# Patient Record
Sex: Female | Born: 1995 | Race: White | Hispanic: No | Marital: Married | Smoking: Never smoker
Health system: Southern US, Community
[De-identification: ages and names within clinical notes are randomized; demographics above are authoritative.]

## PROBLEM LIST (undated history)

## (undated) HISTORY — PX: TONSILLECTOMY: SUR1361

---

## 2019-09-12 ENCOUNTER — Other Ambulatory Visit: Payer: Self-pay | Admitting: Obstetrics and Gynecology

## 2019-09-12 DIAGNOSIS — Z1231 Encounter for screening mammogram for malignant neoplasm of breast: Secondary | ICD-10-CM

## 2019-09-12 DIAGNOSIS — Z1501 Genetic susceptibility to malignant neoplasm of breast: Secondary | ICD-10-CM

## 2019-11-03 ENCOUNTER — Ambulatory Visit
Admission: RE | Admit: 2019-11-03 | Discharge: 2019-11-03 | Disposition: A | Discharge status: Home / Self Care | Payer: BC Managed Care – PPO | Source: Ambulatory Visit | Attending: Obstetrics and Gynecology | Admitting: Obstetrics and Gynecology

## 2019-11-03 ENCOUNTER — Other Ambulatory Visit: Payer: Self-pay

## 2019-11-03 DIAGNOSIS — Z1509 Genetic susceptibility to other malignant neoplasm: Secondary | ICD-10-CM

## 2019-11-03 DIAGNOSIS — Z1231 Encounter for screening mammogram for malignant neoplasm of breast: Secondary | ICD-10-CM

## 2019-11-03 DIAGNOSIS — Z1501 Genetic susceptibility to malignant neoplasm of breast: Secondary | ICD-10-CM

## 2019-11-04 ENCOUNTER — Other Ambulatory Visit: Payer: Self-pay | Admitting: Obstetrics and Gynecology

## 2019-11-04 DIAGNOSIS — R928 Other abnormal and inconclusive findings on diagnostic imaging of breast: Secondary | ICD-10-CM

## 2019-11-07 ENCOUNTER — Other Ambulatory Visit: Payer: Self-pay

## 2019-11-07 ENCOUNTER — Ambulatory Visit
Admission: RE | Admit: 2019-11-07 | Discharge: 2019-11-07 | Disposition: A | Discharge status: Home / Self Care | Payer: BC Managed Care – PPO | Source: Ambulatory Visit | Attending: Obstetrics and Gynecology | Admitting: Obstetrics and Gynecology

## 2019-11-07 DIAGNOSIS — R928 Other abnormal and inconclusive findings on diagnostic imaging of breast: Secondary | ICD-10-CM

## 2020-01-29 ENCOUNTER — Other Ambulatory Visit: Payer: Self-pay | Admitting: Obstetrics and Gynecology

## 2020-01-29 DIAGNOSIS — Z1501 Genetic susceptibility to malignant neoplasm of breast: Secondary | ICD-10-CM

## 2020-04-06 ENCOUNTER — Other Ambulatory Visit: Payer: Self-pay

## 2020-04-06 ENCOUNTER — Ambulatory Visit
Admission: RE | Admit: 2020-04-06 | Discharge: 2020-04-06 | Disposition: A | Discharge status: Home / Self Care | Payer: BC Managed Care – PPO | Source: Ambulatory Visit | Attending: Obstetrics and Gynecology | Admitting: Obstetrics and Gynecology

## 2020-04-06 DIAGNOSIS — Z1501 Genetic susceptibility to malignant neoplasm of breast: Secondary | ICD-10-CM

## 2020-04-06 MED ORDER — GADOBUTROL 1 MMOL/ML IV SOLN
6.0000 mL | Freq: Once | INTRAVENOUS | Status: AC | PRN
  Administered 2020-04-06: 6 mL via INTRAVENOUS

## 2021-09-26 ENCOUNTER — Other Ambulatory Visit: Payer: Self-pay | Admitting: Obstetrics and Gynecology

## 2021-09-26 DIAGNOSIS — R928 Other abnormal and inconclusive findings on diagnostic imaging of breast: Secondary | ICD-10-CM

## 2021-10-31 ENCOUNTER — Ambulatory Visit
Admission: RE | Admit: 2021-10-31 | Discharge: 2021-10-31 | Disposition: A | Discharge status: Home / Self Care | Payer: No Typology Code available for payment source | Source: Ambulatory Visit | Attending: Obstetrics and Gynecology | Admitting: Obstetrics and Gynecology

## 2021-10-31 ENCOUNTER — Other Ambulatory Visit: Payer: Self-pay

## 2021-10-31 ENCOUNTER — Other Ambulatory Visit: Payer: Self-pay | Admitting: Obstetrics and Gynecology

## 2021-10-31 DIAGNOSIS — R928 Other abnormal and inconclusive findings on diagnostic imaging of breast: Secondary | ICD-10-CM

## 2021-10-31 DIAGNOSIS — N6489 Other specified disorders of breast: Secondary | ICD-10-CM

## 2021-11-09 ENCOUNTER — Ambulatory Visit
Admission: RE | Admit: 2021-11-09 | Discharge: 2021-11-09 | Disposition: A | Discharge status: Home / Self Care | Payer: No Typology Code available for payment source | Source: Ambulatory Visit | Attending: Obstetrics and Gynecology | Admitting: Obstetrics and Gynecology

## 2021-11-09 ENCOUNTER — Other Ambulatory Visit: Payer: Self-pay

## 2021-11-09 DIAGNOSIS — N6489 Other specified disorders of breast: Secondary | ICD-10-CM

## 2021-11-09 HISTORY — PX: BREAST BIOPSY: SHX20

## 2022-02-27 ENCOUNTER — Other Ambulatory Visit: Payer: Self-pay | Admitting: Obstetrics and Gynecology

## 2022-02-27 DIAGNOSIS — Z1501 Genetic susceptibility to malignant neoplasm of breast: Secondary | ICD-10-CM

## 2022-04-05 ENCOUNTER — Ambulatory Visit
Admission: RE | Admit: 2022-04-05 | Discharge: 2022-04-05 | Disposition: A | Discharge status: Home / Self Care | Payer: 59 | Source: Ambulatory Visit | Attending: Obstetrics and Gynecology | Admitting: Obstetrics and Gynecology

## 2022-04-05 DIAGNOSIS — Z1509 Genetic susceptibility to other malignant neoplasm: Secondary | ICD-10-CM

## 2022-04-05 MED ORDER — GADOBUTROL 1 MMOL/ML IV SOLN
6.0000 mL | Freq: Once | INTRAVENOUS | Status: AC | PRN
  Administered 2022-04-05: 6 mL via INTRAVENOUS

## 2022-09-28 ENCOUNTER — Other Ambulatory Visit: Payer: Self-pay | Admitting: Obstetrics and Gynecology

## 2022-09-28 ENCOUNTER — Encounter: Payer: Self-pay | Admitting: Obstetrics and Gynecology

## 2022-09-28 DIAGNOSIS — R928 Other abnormal and inconclusive findings on diagnostic imaging of breast: Secondary | ICD-10-CM

## 2022-10-12 ENCOUNTER — Ambulatory Visit
Admission: RE | Admit: 2022-10-12 | Discharge: 2022-10-12 | Disposition: A | Discharge status: Home / Self Care | Payer: 59 | Source: Ambulatory Visit | Attending: Obstetrics and Gynecology | Admitting: Obstetrics and Gynecology

## 2022-10-12 DIAGNOSIS — R928 Other abnormal and inconclusive findings on diagnostic imaging of breast: Secondary | ICD-10-CM

## 2022-12-20 LAB — OB RESULTS CONSOLE HEPATITIS B SURFACE ANTIGEN: Hepatitis B Surface Ag: NEGATIVE

## 2022-12-20 LAB — OB RESULTS CONSOLE GC/CHLAMYDIA
Chlamydia: NEGATIVE
Neisseria Gonorrhea: NEGATIVE

## 2022-12-20 LAB — OB RESULTS CONSOLE ABO/RH: RH Type: POSITIVE

## 2022-12-20 LAB — OB RESULTS CONSOLE RPR: RPR: NONREACTIVE

## 2022-12-20 LAB — OB RESULTS CONSOLE HIV ANTIBODY (ROUTINE TESTING): HIV: NONREACTIVE

## 2022-12-20 LAB — HEPATITIS C ANTIBODY: HCV Ab: NEGATIVE

## 2022-12-20 LAB — OB RESULTS CONSOLE RUBELLA ANTIBODY, IGM: Rubella: IMMUNE

## 2022-12-20 LAB — OB RESULTS CONSOLE ANTIBODY SCREEN: Antibody Screen: NEGATIVE

## 2023-06-18 LAB — OB RESULTS CONSOLE GBS: GBS: NEGATIVE

## 2023-07-02 ENCOUNTER — Encounter (HOSPITAL_COMMUNITY): Payer: Self-pay

## 2023-07-02 NOTE — Patient Instructions (Signed)
KESHONDRA SUPINGER  07/02/2023   Your procedure is scheduled on:  07/12/2023  Arrive at 1015 at Entrance C on [employer] at [facility]  and [employer]. You are invited to use the FREE valet parking or use the Visitor's parking deck.  Pick up the phone at the desk and dial [phone].  Call this number if you have problems the morning of surgery: [phone]  Remember:   Do not eat food:(After Midnight) Desps de medianoche.  Do not drink clear liquids: (After Midnight) Desps de medianoche.  Take these medicines the morning of surgery with A SIP OF WATER:  none   Do not wear jewelry, make-up or nail polish.  Do not wear lotions, powders, or perfumes. Do not wear deodorant.  Do not shave 48 hours prior to surgery.  Do not bring valuables to the hospital.  [facility] is not   responsible for any belongings or valuables brought to the hospital.  Contacts, dentures or bridgework may not be worn into surgery.  Leave suitcase in the car. After surgery it may be brought to your room.  For patients admitted to the hospital, checkout time is 11:00 AM the day of              discharge.      Please read over the following fact sheets that you were given:     Preparing for Surgery

## 2023-07-06 ENCOUNTER — Other Ambulatory Visit: Payer: Self-pay | Admitting: Obstetrics and Gynecology

## 2023-07-06 DIAGNOSIS — O321XX0 Maternal care for breech presentation, not applicable or unspecified: Secondary | ICD-10-CM

## 2023-07-10 ENCOUNTER — Encounter (HOSPITAL_COMMUNITY)
Admission: RE | Admit: 2023-07-10 | Discharge: 2023-07-10 | Disposition: A | Discharge status: Home / Self Care | Payer: 59 | Source: Ambulatory Visit | Attending: Obstetrics and Gynecology | Admitting: Obstetrics and Gynecology

## 2023-07-10 DIAGNOSIS — Z3A39 39 weeks gestation of pregnancy: Secondary | ICD-10-CM | POA: Insufficient documentation

## 2023-07-10 DIAGNOSIS — Z01812 Encounter for preprocedural laboratory examination: Secondary | ICD-10-CM | POA: Insufficient documentation

## 2023-07-10 DIAGNOSIS — O321XX0 Maternal care for breech presentation, not applicable or unspecified: Secondary | ICD-10-CM

## 2023-07-10 LAB — TYPE AND SCREEN
ABO/RH(D): A POS
Antibody Screen: NEGATIVE

## 2023-07-10 LAB — CBC
HCT: 34.3 % — ABNORMAL LOW (ref 36.0–46.0)
Hemoglobin: 11.2 g/dL — ABNORMAL LOW (ref 12.0–15.0)
MCH: 30.1 pg (ref 26.0–34.0)
MCHC: 32.7 g/dL (ref 30.0–36.0)
MCV: 92.2 fL (ref 80.0–100.0)
Platelets: 210 10*3/uL (ref 150–400)
RBC: 3.72 MIL/uL — ABNORMAL LOW (ref 3.87–5.11)
RDW: 11.9 % (ref 11.5–15.5)
WBC: 11.1 10*3/uL — ABNORMAL HIGH (ref 4.0–10.5)
nRBC: 0 % (ref 0.0–0.2)

## 2023-07-10 LAB — RPR: RPR Ser Ql: NONREACTIVE

## 2023-07-12 ENCOUNTER — Inpatient Hospital Stay (HOSPITAL_COMMUNITY): Payer: 59 | Admitting: Anesthesiology

## 2023-07-12 ENCOUNTER — Other Ambulatory Visit: Payer: Self-pay

## 2023-07-12 ENCOUNTER — Encounter (HOSPITAL_COMMUNITY)
Admission: RE | Disposition: A | Discharge status: Home / Self Care | Payer: Self-pay | Source: Home / Self Care | Attending: Obstetrics and Gynecology

## 2023-07-12 ENCOUNTER — Inpatient Hospital Stay (HOSPITAL_COMMUNITY)
Admission: RE | Admit: 2023-07-12 | Discharge: 2023-07-14 | DRG: 788 | Disposition: A | Discharge status: Home / Self Care | Payer: 59 | Attending: Obstetrics and Gynecology | Admitting: Obstetrics and Gynecology

## 2023-07-12 ENCOUNTER — Encounter (HOSPITAL_COMMUNITY): Payer: Self-pay | Admitting: Obstetrics and Gynecology

## 2023-07-12 DIAGNOSIS — O26893 Other specified pregnancy related conditions, third trimester: Secondary | ICD-10-CM | POA: Diagnosis present

## 2023-07-12 DIAGNOSIS — O321XX1 Maternal care for breech presentation, fetus 1: Secondary | ICD-10-CM

## 2023-07-12 DIAGNOSIS — Z3A39 39 weeks gestation of pregnancy: Secondary | ICD-10-CM | POA: Diagnosis not present

## 2023-07-12 DIAGNOSIS — O321XX0 Maternal care for breech presentation, not applicable or unspecified: Principal | ICD-10-CM | POA: Diagnosis present

## 2023-07-12 LAB — ABO/RH: ABO/RH(D): A POS

## 2023-07-12 SURGERY — Surgical Case
Anesthesia: Spinal

## 2023-07-12 MED ORDER — ONDANSETRON HCL 4 MG/2ML IJ SOLN: 4.0000 mg | Freq: Three times a day (TID) | INTRAMUSCULAR | Status: DC | PRN

## 2023-07-12 MED ORDER — OXYTOCIN-SODIUM CHLORIDE 30-0.9 UT/500ML-% IV SOLN
INTRAVENOUS | Status: DC | PRN
  Administered 2023-07-12: 300 mL via INTRAVENOUS

## 2023-07-12 MED ORDER — OXYTOCIN-SODIUM CHLORIDE 30-0.9 UT/500ML-% IV SOLN
2.5000 [IU]/h | INTRAVENOUS | Status: AC
  Administered 2023-07-12: 2.5 [IU]/h via INTRAVENOUS
  Filled 2023-07-12: qty 500

## 2023-07-12 MED ORDER — PHENYLEPHRINE HCL-NACL 20-0.9 MG/250ML-% IV SOLN
INTRAVENOUS | Status: AC
  Filled 2023-07-12: qty 500

## 2023-07-12 MED ORDER — ONDANSETRON HCL 4 MG/2ML IJ SOLN
INTRAMUSCULAR | Status: DC | PRN
  Administered 2023-07-12: 4 mg via INTRAVENOUS

## 2023-07-12 MED ORDER — CEFAZOLIN SODIUM-DEXTROSE 2-4 GM/100ML-% IV SOLN
INTRAVENOUS | Status: AC
  Filled 2023-07-12: qty 100

## 2023-07-12 MED ORDER — PRENATAL MULTIVITAMIN CH
1.0000 | ORAL_TABLET | Freq: Every day | ORAL | Status: DC
  Administered 2023-07-13: 1 via ORAL
  Filled 2023-07-12 (×2): qty 1

## 2023-07-12 MED ORDER — POVIDONE-IODINE 10 % EX SWAB
2.0000 | Freq: Once | CUTANEOUS | Status: AC
  Administered 2023-07-12: 2 via TOPICAL

## 2023-07-12 MED ORDER — DEXAMETHASONE SODIUM PHOSPHATE 10 MG/ML IJ SOLN
INTRAMUSCULAR | Status: AC
  Filled 2023-07-12: qty 1

## 2023-07-12 MED ORDER — STERILE WATER FOR IRRIGATION IR SOLN
Status: DC | PRN
  Administered 2023-07-12: 1

## 2023-07-12 MED ORDER — DIPHENHYDRAMINE HCL 25 MG PO CAPS: 25.0000 mg | ORAL_CAPSULE | Freq: Four times a day (QID) | ORAL | Status: DC | PRN

## 2023-07-12 MED ORDER — DIPHENHYDRAMINE HCL 50 MG/ML IJ SOLN: 12.5000 mg | INTRAMUSCULAR | Status: DC | PRN

## 2023-07-12 MED ORDER — ACETAMINOPHEN 500 MG PO TABS
1000.0000 mg | ORAL_TABLET | Freq: Four times a day (QID) | ORAL | Status: DC
  Administered 2023-07-12 – 2023-07-14 (×7): 1000 mg via ORAL
  Filled 2023-07-12 (×8): qty 2

## 2023-07-12 MED ORDER — TRANEXAMIC ACID-NACL 1000-0.7 MG/100ML-% IV SOLN
INTRAVENOUS | Status: AC
  Filled 2023-07-12: qty 100

## 2023-07-12 MED ORDER — LACTATED RINGERS IV SOLN: INTRAVENOUS | Status: DC

## 2023-07-12 MED ORDER — IBUPROFEN 600 MG PO TABS
600.0000 mg | ORAL_TABLET | Freq: Four times a day (QID) | ORAL | Status: DC
  Administered 2023-07-13 – 2023-07-14 (×7): 600 mg via ORAL
  Filled 2023-07-12 (×7): qty 1

## 2023-07-12 MED ORDER — BUPIVACAINE IN DEXTROSE 0.75-8.25 % IT SOLN
INTRATHECAL | Status: DC | PRN
  Administered 2023-07-12: 1.6 mL via INTRATHECAL

## 2023-07-12 MED ORDER — ZOLPIDEM TARTRATE 5 MG PO TABS: 5.0000 mg | ORAL_TABLET | Freq: Every evening | ORAL | Status: DC | PRN

## 2023-07-12 MED ORDER — DIBUCAINE (PERIANAL) 1 % EX OINT: 1.0000 | TOPICAL_OINTMENT | CUTANEOUS | Status: DC | PRN

## 2023-07-12 MED ORDER — NALOXONE HCL 4 MG/10ML IJ SOLN: 1.0000 ug/kg/h | INTRAVENOUS | Status: DC | PRN

## 2023-07-12 MED ORDER — DEXAMETHASONE SODIUM PHOSPHATE 10 MG/ML IJ SOLN
INTRAMUSCULAR | Status: DC | PRN
Start: 2023-07-12 — End: 2023-07-12
  Administered 2023-07-12: 10 mg via INTRAVENOUS

## 2023-07-12 MED ORDER — ONDANSETRON HCL 4 MG/2ML IJ SOLN
INTRAMUSCULAR | Status: AC
  Filled 2023-07-12: qty 2

## 2023-07-12 MED ORDER — TRANEXAMIC ACID-NACL 1000-0.7 MG/100ML-% IV SOLN
1000.0000 mg | Freq: Once | INTRAVENOUS | Status: AC
  Administered 2023-07-12: 1000 mg via INTRAVENOUS

## 2023-07-12 MED ORDER — SCOPOLAMINE 1 MG/3DAYS TD PT72: 1.0000 | MEDICATED_PATCH | Freq: Once | TRANSDERMAL | Status: DC

## 2023-07-12 MED ORDER — KETOROLAC TROMETHAMINE 30 MG/ML IJ SOLN: 30.0000 mg | Freq: Four times a day (QID) | INTRAMUSCULAR | Status: AC | PRN

## 2023-07-12 MED ORDER — ACETAMINOPHEN 10 MG/ML IV SOLN
INTRAVENOUS | Status: DC | PRN
  Administered 2023-07-12: 1000 mg via INTRAVENOUS

## 2023-07-12 MED ORDER — COCONUT OIL OIL: 1.0000 | TOPICAL_OIL | Status: DC | PRN

## 2023-07-12 MED ORDER — SODIUM CHLORIDE 0.9% FLUSH: 3.0000 mL | INTRAVENOUS | Status: DC | PRN

## 2023-07-12 MED ORDER — SIMETHICONE 80 MG PO CHEW
80.0000 mg | CHEWABLE_TABLET | Freq: Three times a day (TID) | ORAL | Status: DC
  Administered 2023-07-12 – 2023-07-14 (×4): 80 mg via ORAL
  Filled 2023-07-12 (×4): qty 1

## 2023-07-12 MED ORDER — FENTANYL CITRATE (PF) 100 MCG/2ML IJ SOLN
INTRAMUSCULAR | Status: AC
  Filled 2023-07-12: qty 2

## 2023-07-12 MED ORDER — MENTHOL 3 MG MT LOZG: 1.0000 | LOZENGE | OROMUCOSAL | Status: DC | PRN

## 2023-07-12 MED ORDER — SENNOSIDES-DOCUSATE SODIUM 8.6-50 MG PO TABS
2.0000 | ORAL_TABLET | Freq: Every day | ORAL | Status: DC
  Administered 2023-07-13 – 2023-07-14 (×2): 2 via ORAL
  Filled 2023-07-12 (×2): qty 2

## 2023-07-12 MED ORDER — FENTANYL CITRATE (PF) 100 MCG/2ML IJ SOLN
INTRAMUSCULAR | Status: DC | PRN
  Administered 2023-07-12: 15 ug via INTRATHECAL

## 2023-07-12 MED ORDER — MORPHINE SULFATE (PF) 0.5 MG/ML IJ SOLN
INTRAMUSCULAR | Status: AC
  Filled 2023-07-12: qty 10

## 2023-07-12 MED ORDER — PHENYLEPHRINE HCL-NACL 20-0.9 MG/250ML-% IV SOLN
INTRAVENOUS | Status: DC | PRN
  Administered 2023-07-12: 60 ug/min via INTRAVENOUS

## 2023-07-12 MED ORDER — FENTANYL CITRATE (PF) 100 MCG/2ML IJ SOLN: 25.0000 ug | INTRAMUSCULAR | Status: DC | PRN

## 2023-07-12 MED ORDER — CEFAZOLIN SODIUM-DEXTROSE 2-4 GM/100ML-% IV SOLN
2.0000 g | INTRAVENOUS | Status: AC
  Administered 2023-07-12: 2 g via INTRAVENOUS

## 2023-07-12 MED ORDER — WITCH HAZEL-GLYCERIN EX PADS: 1.0000 | MEDICATED_PAD | CUTANEOUS | Status: DC | PRN

## 2023-07-12 MED ORDER — MORPHINE SULFATE (PF) 0.5 MG/ML IJ SOLN
INTRAMUSCULAR | Status: DC | PRN
  Administered 2023-07-12: 150 ug via INTRATHECAL

## 2023-07-12 MED ORDER — KETOROLAC TROMETHAMINE 30 MG/ML IJ SOLN
30.0000 mg | Freq: Four times a day (QID) | INTRAMUSCULAR | Status: AC | PRN
  Administered 2023-07-12: 30 mg via INTRAVENOUS
  Filled 2023-07-12: qty 1

## 2023-07-12 MED ORDER — ACETAMINOPHEN 500 MG PO TABS: 1000.0000 mg | ORAL_TABLET | Freq: Four times a day (QID) | ORAL | Status: DC

## 2023-07-12 MED ORDER — NALOXONE HCL 0.4 MG/ML IJ SOLN: 0.4000 mg | INTRAMUSCULAR | Status: DC | PRN

## 2023-07-12 MED ORDER — SIMETHICONE 80 MG PO CHEW: 80.0000 mg | CHEWABLE_TABLET | ORAL | Status: DC | PRN

## 2023-07-12 MED ORDER — OXYCODONE HCL 5 MG PO TABS: 5.0000 mg | ORAL_TABLET | ORAL | Status: DC | PRN

## 2023-07-12 MED ORDER — DIPHENHYDRAMINE HCL 25 MG PO CAPS: 25.0000 mg | ORAL_CAPSULE | ORAL | Status: DC | PRN

## 2023-07-12 SURGICAL SUPPLY — 32 items
ADH SKN CLS APL DERMABOND .7 (GAUZE/BANDAGES/DRESSINGS) ×1
APL PRP STRL LF DISP 70% ISPRP (MISCELLANEOUS) ×2
CHLORAPREP W/TINT 26 (MISCELLANEOUS) ×2 IMPLANT
CLAMP UMBILICAL CORD (MISCELLANEOUS) ×1 IMPLANT
CLIP FILSHIE TUBAL LIGA STRL (Clip) IMPLANT
CLOTH BEACON ORANGE TIMEOUT ST (SAFETY) ×1 IMPLANT
DERMABOND ADVANCED .7 DNX12 (GAUZE/BANDAGES/DRESSINGS) IMPLANT
DRSG OPSITE POSTOP 4X10 (GAUZE/BANDAGES/DRESSINGS) ×1 IMPLANT
ELECT REM PT RETURN 9FT ADLT (ELECTROSURGICAL) ×1
ELECTRODE REM PT RTRN 9FT ADLT (ELECTROSURGICAL) ×1 IMPLANT
EXTRACTOR VACUUM KIWI (MISCELLANEOUS) IMPLANT
GLOVE BIO SURGEON STRL SZ7 (GLOVE) ×1 IMPLANT
GLOVE BIOGEL PI IND STRL 7.0 (GLOVE) ×1 IMPLANT
GOWN STRL REUS W/TWL LRG LVL3 (GOWN DISPOSABLE) ×2 IMPLANT
KIT ABG SYR 3ML LUER SLIP (SYRINGE) IMPLANT
NDL HYPO 25X5/8 SAFETYGLIDE (NEEDLE) IMPLANT
NEEDLE HYPO 22GX1.5 SAFETY (NEEDLE) IMPLANT
NEEDLE HYPO 25X5/8 SAFETYGLIDE (NEEDLE) IMPLANT
NS IRRIG 1000ML POUR BTL (IV SOLUTION) ×1 IMPLANT
PACK C SECTION WH (CUSTOM PROCEDURE TRAY) ×1 IMPLANT
PAD OB MATERNITY 4.3X12.25 (PERSONAL CARE ITEMS) ×1 IMPLANT
RTRCTR C-SECT PINK 25CM LRG (MISCELLANEOUS) ×1 IMPLANT
SUT CHROMIC 1 CTX 36 (SUTURE) ×2 IMPLANT
SUT CHROMIC 2 0 CT 1 (SUTURE) ×1 IMPLANT
SUT PDS AB 0 CTX 60 (SUTURE) ×1 IMPLANT
SUT VIC AB 2-0 CT1 27 (SUTURE) ×1
SUT VIC AB 2-0 CT1 TAPERPNT 27 (SUTURE) ×1 IMPLANT
SUT VIC AB 4-0 KS 27 (SUTURE) IMPLANT
SYR 30ML LL (SYRINGE) IMPLANT
TOWEL OR 17X24 6PK STRL BLUE (TOWEL DISPOSABLE) ×1 IMPLANT
TRAY FOLEY W/BAG SLVR 14FR LF (SET/KITS/TRAYS/PACK) ×1 IMPLANT
WATER STERILE IRR 1000ML POUR (IV SOLUTION) ×1 IMPLANT

## 2023-07-12 NOTE — Lactation Note (Addendum)
This note was copied from a baby's chart. Lactation Consultation Note  Patient Name: Tracy Diaz [id number] Date: 07/12/2023 Age:29 hours  Reason for consult: Initial assessment;Primapara;1st time breastfeeding;Breastfeeding assistance;Term  P1, [redacted]w[redacted]d, cesarean delivery  Mother had baby skin to skin attempting to latch  baby Tracy "Jeanella Anton" upon arrival to room. Basic breastfeeding education and assistance with latch in a reclined cradle position. Baby is alert and eagerly latching to the breast with a wide gape. Air spacing around infant's nose was done due to her position and nasal stuffiness. Infant fed well for 15 minutes.   Mother had questions regarding breastfeeding and frequency, positions and latch. Father brought to bedside to have him assist with latch and observe for safe attachment and breathing.   Encouraged to latch baby with feeding cues, place baby skin to skin if not latching, and call for assistance with breastfeeding as needed. Anticipate as baby approaches 24 hours of age, baby will feed more often (8-12 plus times in 24 hours) at breast referred to as  "cluster feeding".    Mom made aware of O/P services, breastfeeding support groups, community resources, and our phone # for post-discharge questions.       Maternal Data Has patient been taught Hand Expression?: Yes Does the patient have breastfeeding experience prior to this delivery?: No  Feeding Mother's Current Feeding Choice: Breast Milk and Formula  LATCH Score Latch: Grasps breast easily, tongue down, lips flanged, rhythmical sucking.  Audible Swallowing: A few with stimulation  Type of Nipple: Everted at rest and after stimulation  Comfort (Breast/Nipple): Soft / non-tender  Hold (Positioning): Full assist, staff holds infant at breast  LATCH Score: 7      Interventions Interventions: Breast feeding basics reviewed;Assisted with latch;Skin to skin;Hand express;Breast compression;Support  pillows;Adjust position;Education;LC Services brochure  Discharge Pump: DEBP;Personal  Consult Status Consult Status: Follow-up Date: 07/13/23 Follow-up type: In-patient    Christella Hartigan M 07/12/2023, 5:51 PM

## 2023-07-12 NOTE — Op Note (Signed)
Operative Report   Pre-Operative Diagnosis: 1) 39+5-week intrauterine pregnancy 2) elective primary cesarean section  Postoperative Diagnosis: 1) 39+5-week intrauterine pregnancy 2) elective primary cesarean section  Procedure: Primary low-transverse cesarean section  Surgeon: Dr. Waynard Reeds  Assistant: Dr. Ellison Hughs  Antibiotics: Ancef 2 g  Operative Findings: Vigorous female infant in the vertex presentation with Apgar scores of 8 at 1 minute and 9 at 5 minutes.  Normal-appearing ovaries and tubes.  Specimen: Placenta for disposal  EBL: Total I/O In: 600 [I.V.:300; IV Piggyback:300] Out: 1121 [Urine:300; Blood:821]   Procedure:Tracy Diaz is an 27 year old gravida 1 para 0 at 19 weeks and 5 days estimated gestational age who presents for cesarean section. Following the appropriate informed consent the patient was brought to the operating room where spinal anesthesia was administered and found to be adequate. She was placed in the dorsal supine position with a leftward tilt. She was prepped and draped in the normal sterile fashion.  The patient was appropriately identified during a preoperative timeout procedure.  The scalpel was then used to make a Pfannenstiel skin incision which was carried down to the underlying layers of soft tissue to the fascia. The fascia was incised in the midline and the fascial incision was extended laterally with Mayo scissors. The superior aspect of the fascial incision was grasped with Coker clamps x2, tented up and the rectus muscles dissected off sharply with the electrocautery unit. The same procedure was repeated on the inferior aspect of the fascial incision. The rectus muscles were separated in the midline. The abdominal peritoneum was identified, tented up, entered sharply, and the incision was extended superiorly and inferiorly with good visualization of the bladder. The Alexis retractor was then deployed. The vesicouterine peritoneum was  identified, tented up, entered sharply, and the bladder flap was created digitally.  The scalpel was then used to make a low transverse incision on the uterus which was extended laterally with blunt dissection. The fetal vertex was identified, delivered easily through the uterine incision followed by the body. The infant was bulb suctioned on the operative field and cried vigorously.  Following a 1 minute delay, the cord was clamped and cut. The infant was passed to the waiting neonatology team. Placenta was then delivered spontaneously and the uterus was cleared of all clot and debris. The uterine incision was repaired with #1 chromic in running locked fashion followed by a second imbricating layer. The ovaries and tubes were inspected and normal. The Alexis retractor was removed. The abdominal peritoneum was reapproximated with 2-0 Vicryl in a running fashion. The rectus muscles was reapproximated with 2-0 chromic in a running fashion. The fascia was closed with 0-looped PDS in a running fashion. The skin was closed with 4-0 vicryl in a subcuticular fashion and Dermabond. All laparotomy sponge, instrument, and needle counts were correct.  The patient tolerated the procedure well and was transferred to the recovery unit in stable condition following the procedure.

## 2023-07-12 NOTE — Transfer of Care (Signed)
Immediate Anesthesia Transfer of Care Note  Patient: Tracy Diaz  Procedure(s) Performed: CESAREAN SECTION  Patient Location: PACU  Anesthesia Type:Spinal  Level of Consciousness: awake, alert , and oriented  Airway & Oxygen Therapy: Patient Spontanous Breathing  Post-op Assessment: Report given to RN and Post -op Vital signs reviewed and stable  Post vital signs: Reviewed and stable  Last Vitals:  Vitals Value Taken Time  BP 121/71 07/12/23 1321  Temp    Pulse 77 07/12/23 1323  Resp 16 07/12/23 1323  SpO2 99 % 07/12/23 1323  Vitals shown include unfiled device data.  Last Pain:  Vitals:   07/12/23 1025  TempSrc: Oral         Complications: No notable events documented.

## 2023-07-12 NOTE — Anesthesia Preprocedure Evaluation (Addendum)
Anesthesia Evaluation  Patient identified by MRN, date of birth, ID band Patient awake    Reviewed: Allergy & Precautions, NPO status , Patient's Chart, lab work & pertinent test results  Airway Mallampati: II  TM Distance: >3 FB Neck ROM: Full    Dental   Pulmonary neg pulmonary ROS   breath sounds clear to auscultation       Cardiovascular negative cardio ROS  Rhythm:Regular Rate:Normal     Neuro/Psych negative neurological ROS     GI/Hepatic negative GI ROS, Neg liver ROS,,,  Endo/Other  negative endocrine ROS    Renal/GU negative Renal ROS     Musculoskeletal   Abdominal   Peds  Hematology negative hematology ROS (+)   Anesthesia Other Findings   Reproductive/Obstetrics                             Anesthesia Physical Anesthesia Plan  ASA: 2  Anesthesia Plan: Spinal   Post-op Pain Management:    Induction:   PONV Risk Score and Plan: 2 and Dexamethasone, Ondansetron and Treatment may vary due to age or medical condition  Airway Management Planned: Natural Airway  Additional Equipment:   Intra-op Plan:   Post-operative Plan:   Informed Consent: I have reviewed the patients History and Physical, chart, labs and discussed the procedure including the risks, benefits and alternatives for the proposed anesthesia with the patient or authorized representative who has indicated his/her understanding and acceptance.       Plan Discussed with: CRNA  Anesthesia Plan Comments:        Anesthesia Quick Evaluation

## 2023-07-12 NOTE — Anesthesia Procedure Notes (Signed)
Spinal  Patient location during procedure: OR Start time: 07/12/2023 12:05 PM End time: 07/12/2023 12:10 PM Reason for block: surgical anesthesia Staffing Performed: anesthesiologist  Anesthesiologist: Marcene Duos, MD Performed by: Marcene Duos, MD Authorized by: Marcene Duos, MD   Preanesthetic Checklist Completed: patient identified, IV checked, site marked, risks and benefits discussed, surgical consent, monitors and equipment checked, pre-op evaluation and timeout performed Spinal Block Patient position: sitting Prep: DuraPrep Patient monitoring: heart rate, cardiac monitor, continuous pulse ox and blood pressure Approach: midline Location: L4-5 Injection technique: single-shot Needle Needle type: Pencan  Needle gauge: 24 G Needle length: 9 cm Assessment Sensory level: T4 Events: CSF return

## 2023-07-12 NOTE — H&P (Signed)
Tracy Diaz is a 27 y.o. female presenting for scheduled cesarean section  27 year old G1, P0 at 39+5 confirmed by first trimester ultrasound presents for scheduled primary cesarean section.  Initially patient C-section was scheduled for breech presentation.  Fetus spontaneously verted to vertex presentation.  This was confirmed at her prenatal visit on 07/06/2023.  After careful consideration of risks benefits and alternatives of primary cesarean versus trial of labor the patient wishes to proceed with primary cesarean section.  Her pregnancy has been otherwise uncomplicated. OB History     Gravida  1   Para      Term      Preterm      AB      Living         SAB      IAB      Ectopic      Multiple      Live Births             History reviewed. No pertinent past medical history. Past Surgical History:  Procedure Laterality Date   BREAST BIOPSY  11/09/2021   stereo biopsy/x clip/ path pending   TONSILLECTOMY     Family History: family history includes BRCA 1/2 in her mother; Breast cancer (age of onset: 71) in her cousin; Breast cancer (age of onset: 39) in her maternal aunt; Breast cancer (age of onset: 55) in her maternal grandfather. Social History:  reports that she has never smoked. She has never been exposed to tobacco smoke. She has never used smokeless tobacco. She reports that she does not drink alcohol and does not use drugs.     Maternal Diabetes: No Genetic Screening: Normal Maternal Ultrasounds/Referrals: Normal Fetal Ultrasounds or other Referrals:  None Maternal Substance Abuse:  No Significant Maternal Medications:  None Significant Maternal Lab Results:  None Number of Prenatal Visits:greater than 3 verified prenatal visits Other Comments:  None  Review of Systems History   Blood pressure (!) 135/92, temperature 98.4 F (36.9 C), temperature source Oral, height 5\' 6"  (1.676 m), weight 86.2 kg. Exam Physical Exam   AOx3, NAD Gravid,  soft FHR presetn  Prenatal labs: ABO, Rh: --/--/PENDING (08/15 1054) Antibody: NEG (08/13 1017) Rubella: Immune (01/24 0000) RPR: NON REACTIVE (08/13 1030)  HBsAg: Negative (01/24 0000)  HIV: Non-reactive (01/24 0000)  GBS: Negative/-- (07/22 0000)   Assessment/Plan: 1) admit 2) proceed with primary elective cesarean section 3) Ancef 2 g on-call to the OR 3) SCDs for DVT prophylaxis   Waynard Reeds 07/12/2023, 11:26 AM

## 2023-07-13 LAB — CBC
HCT: 26.7 % — ABNORMAL LOW (ref 36.0–46.0)
Hemoglobin: 8.9 g/dL — ABNORMAL LOW (ref 12.0–15.0)
MCH: 29.5 pg (ref 26.0–34.0)
MCHC: 33.3 g/dL (ref 30.0–36.0)
MCV: 88.4 fL (ref 80.0–100.0)
Platelets: 211 10*3/uL (ref 150–400)
RBC: 3.02 MIL/uL — ABNORMAL LOW (ref 3.87–5.11)
RDW: 11.9 % (ref 11.5–15.5)
WBC: 19.8 10*3/uL — ABNORMAL HIGH (ref 4.0–10.5)
nRBC: 0 % (ref 0.0–0.2)

## 2023-07-13 NOTE — Lactation Note (Signed)
This note was copied from a baby's chart. Lactation Consultation Note  Patient Name: Girl Bridgetta Mcquillan [id number] Date: 07/13/2023 Age:29 hours  Mother had called out for lactation consultant assistance earlier. Once able to go to the room, mother states baby breast fed well for 30 minutes. Mother feels that she and baby are making positive strides with breastfeeding.  Will call for nurse or LC if needing further breastfeeding assistance.     Christella Hartigan M 07/13/2023, 11:10 AM

## 2023-07-13 NOTE — Progress Notes (Signed)
Subjective: Postpartum Day 1: Cesarean Delivery Patient reports pain controlled, tolerating po, ambulating and voiding without difficulty  Objective: Vital signs in last 24 hours: Temp:  [98 F (36.7 C)-98.5 F (36.9 C)] 98 F (36.7 C) (08/16 0559) Pulse Rate:  [62-75] 65 (08/16 0559) Resp:  [10-20] 16 (08/16 0559) BP: (112-135)/(65-96) 126/81 (08/16 0559) SpO2:  [97 %-99 %] 99 % (08/16 0559) Weight:  [86.2 kg] 86.2 kg (08/15 1025)  Physical Exam:  General: alert, cooperative, and appears stated age Lochia: appropriate Uterine Fundus: firm Incision: healing well DVT Evaluation: No evidence of DVT seen on physical exam.  Recent Labs    07/10/23 1030 07/13/23 0420  HGB 11.2* 8.9*  HCT 34.3* 26.7*    Assessment/Plan: Status post Cesarean section. Doing well postoperatively.  Continue current care.  Waynard Reeds, MD 07/13/2023, 10:05 AM

## 2023-07-14 MED ORDER — ACETAMINOPHEN 325 MG PO TABS: 650.0000 mg | ORAL_TABLET | Freq: Four times a day (QID) | ORAL | Status: AC | PRN

## 2023-07-14 MED ORDER — FERROUS SULFATE 325 (65 FE) MG PO TABS
325.0000 mg | ORAL_TABLET | Freq: Every day | ORAL | Status: DC
  Administered 2023-07-14: 325 mg via ORAL
  Filled 2023-07-14: qty 1

## 2023-07-14 MED ORDER — OXYCODONE HCL 5 MG PO TABS: 5.0000 mg | ORAL_TABLET | ORAL | 0 refills | Status: AC | PRN

## 2023-07-14 MED ORDER — IBUPROFEN 600 MG PO TABS: 600.0000 mg | ORAL_TABLET | Freq: Four times a day (QID) | ORAL | 1 refills | Status: AC | PRN

## 2023-07-14 MED ORDER — FERROUS SULFATE 325 (65 FE) MG PO TABS: 325.0000 mg | ORAL_TABLET | Freq: Every day | ORAL | Status: AC

## 2023-07-14 NOTE — Discharge Summary (Signed)
Postpartum Discharge Summary  Date of Service updated 07/14/23      Patient Name: Tracy Diaz DOB: June 01, 1996 MRN: [id number]  Date of admission: 07/12/2023 Delivery date:07/12/2023 Delivering provider: Waynard Reeds Date of discharge: 07/14/2023  Admitting diagnosis: Cesarean delivery delivered [O82] [facility] breech presentation, fetus 1 [O32.1XX1] Intrauterine pregnancy: [redacted]w[redacted]d     Secondary diagnosis:  Principal Problem:   Cesarean delivery delivered Active Problems:   Homero Fellers breech presentation, fetus 1  Additional problems: none    Discharge diagnosis: Term Pregnancy Delivered                                              Post partum procedures: none Augmentation: N/A Complications: None  Hospital course: Sceduled C/S   27 y.o. yo G1P1001 at [redacted]w[redacted]d was admitted to the hospital 07/12/2023 for scheduled cesarean section with the following indication:Elective Primary.Delivery details are as follows:  Membrane Rupture Time/Date: 12:36 PM,07/12/2023  Delivery Method:C-Section, Low Transverse Operative Delivery:N/A Details of operation can be found in separate operative note.  Patient had a postpartum course complicated by nothing.  She is ambulating, tolerating a regular diet, passing flatus, and urinating well. Patient is discharged home in stable condition on  07/14/23        Newborn Data: Birth date:07/12/2023 Birth time:12:37 PM Gender:Female Living status:Living Apgars:8 ,9  Weight:3360 g    Magnesium Sulfate received: No BMZ received: No Rhophylac:N/A MMR:N/A T-DaP:Given prenatally Flu: No Transfusion:No  Physical exam  Vitals:   07/13/23 0559 07/13/23 1441 07/13/23 2021 07/14/23 0554  BP: 126/81 120/73 124/72 121/88  Pulse: 65 65 62 79  Resp: 16  18 18   Temp: 98 F (36.7 C) 98.1 F (36.7 C) 98.5 F (36.9 C) 98.3 F (36.8 C)  TempSrc: Oral Oral Oral Oral  SpO2: 99% 99% 99% 100%  Weight:      Height:       General: alert, cooperative, and no  distress Lochia: appropriate Uterine Fundus: firm Incision: Healing well with no significant drainage DVT Evaluation: No evidence of DVT seen on physical exam. Labs: Lab Results  Component Value Date   WBC 19.8 (H) 07/13/2023   HGB 8.9 (L) 07/13/2023   HCT 26.7 (L) 07/13/2023   MCV 88.4 07/13/2023   PLT 211 07/13/2023       No data to display         Edinburgh Score:    07/12/2023    3:00 PM  Edinburgh Postnatal Depression Scale Screening Tool  I have been able to laugh and see the funny side of things. 0  I have looked forward with enjoyment to things. 0  I have blamed myself unnecessarily when things went wrong. 0  I have been anxious or worried for no good reason. 1  I have felt scared or panicky for no good reason. 0  Things have been getting on top of me. 0  I have been so unhappy that I have had difficulty sleeping. 0  I have felt sad or miserable. 0  I have been so unhappy that I have been crying. 0  The thought of harming myself has occurred to me. 0  Edinburgh Postnatal Depression Scale Total 1      After visit meds:  Allergies as of 07/14/2023   No Known Allergies      Medication List     TAKE these  medications    acetaminophen 325 MG tablet Commonly known as: TYLENOL Take 2 tablets (650 mg total) by mouth every 6 (six) hours as needed for moderate pain, fever, mild pain or headache.   ferrous sulfate 325 (65 FE) MG tablet Take 1 tablet (325 mg total) by mouth daily with breakfast.   ibuprofen 600 MG tablet Commonly known as: ADVIL Take 1 tablet (600 mg total) by mouth every 6 (six) hours as needed for moderate pain or cramping.   multivitamin-prenatal 27-0.8 MG Tabs tablet Take 1 tablet by mouth daily at 12 noon.   oxyCODONE 5 MG immediate release tablet Commonly known as: Oxy IR/ROXICODONE Take 1 tablet (5 mg total) by mouth every 4 (four) hours as needed for severe pain.         Discharge home in stable condition Infant Feeding:  Breast Infant Disposition:home with mother Discharge instruction: per After Visit Summary and Postpartum booklet. Activity: Advance as tolerated. Pelvic rest for 6 weeks.  Diet: iron rich diet Anticipated Birth Control: Unsure Postpartum Appointment:4 weeks Additional Postpartum F/U:  none Future Appointments:No future appointments. Follow up Visit:  Follow-up Information     Waynard Reeds, MD. Schedule an appointment as soon as possible for a visit in 4 week(s).   Specialty: Obstetrics and Gynecology Contact information: [address] [place] [address] [phone]                     07/14/2023 Charlett Nose, MD

## 2023-07-14 NOTE — Progress Notes (Signed)
Subjective: Postpartum Day 2: Cesarean Delivery Patient is doing well this morning. Pain is controlled. Ambulating, voiding, tolerating PO. Minimal lochia. Breastfeeding.   Objective: Patient Vitals for the past 24 hrs:  BP Temp Temp src Pulse Resp SpO2  07/14/23 0554 121/88 98.3 F (36.8 C) Oral 79 18 100 %  07/13/23 2021 124/72 98.5 F (36.9 C) Oral 62 18 99 %  07/13/23 1441 120/73 98.1 F (36.7 C) Oral 65 -- 99 %    Physical Exam:  General: alert, cooperative, and no distress Lochia: appropriate Uterine Fundus: firm Incision: healing well, no significant drainage, no dehiscence, no significant erythema DVT Evaluation: No evidence of DVT seen on physical exam.  Recent Labs    07/13/23 0420  HGB 8.9*  HCT 26.7*    Assessment/Plan: Derry Skill G1P1001 POD#2 sp primary cesarean at [redacted]w[redacted]d 1. PPC: routine PP care 2. Rh pos 3. Dispo: meeting postpartum milestones, eager for discharge home. Instructions reviewed.   Charlett Nose 07/14/2023, 9:29 AM

## 2023-07-16 NOTE — Anesthesia Postprocedure Evaluation (Signed)
Anesthesia Post Note  Patient: Tracy Diaz  Procedure(s) Performed: CESAREAN SECTION     Patient location during evaluation: PACU Anesthesia Type: Spinal Level of consciousness: awake and alert Pain management: pain level controlled Vital Signs Assessment: post-procedure vital signs reviewed and stable Respiratory status: spontaneous breathing and respiratory function stable Cardiovascular status: blood pressure returned to baseline and stable Postop Assessment: spinal receding Anesthetic complications: no   No notable events documented.  Last Vitals:  Vitals:   07/13/23 2021 07/14/23 0554  BP: 124/72 121/88  Pulse: 62 79  Resp: 18 18  Temp: 36.9 C 36.8 C  SpO2: 99% 100%    Last Pain:  Vitals:   07/14/23 0554  TempSrc: Oral  PainSc:                  Kennieth Rad

## 2023-08-09 ENCOUNTER — Telehealth (HOSPITAL_COMMUNITY): Payer: Self-pay | Admitting: *Deleted

## 2023-08-09 NOTE — Telephone Encounter (Signed)
08/09/2023  Name: Tracy Diaz MRN: [id number] DOB: [birth year]  Reason for Call:  Transition of Care Hospital Discharge Call  Contact Status: Patient Contact Status: Complete  Language assistant needed: Interpreter Mode: Interpreter Not Needed        Follow-Up Questions: Do You Have Any Concerns About Your Health As You Heal From Delivery?: No Do You Have Any Concerns About Your Infants Health?: No  Edinburgh Postnatal Depression Scale:  In the Past 7 Days: I have been able to laugh and see the funny side of things.: As much as I always could I have looked forward with enjoyment to things.: As much as I ever did I have blamed myself unnecessarily when things went wrong.: No, never I have been anxious or worried for no good reason.: No, not at all I have felt scared or panicky for no good reason.: No, not at all Things have been getting on top of me.: No, I have been coping as well as ever I have been so unhappy that I have had difficulty sleeping.: Not at all I have felt sad or miserable.: No, not at all I have been so unhappy that I have been crying.: No, never The thought of harming myself has occurred to me.: Never Inocente Salles Postnatal Depression Scale Total: 0  PHQ2-9 Depression Scale:     Discharge Follow-up: Edinburgh score requires follow up?: No Patient was advised of the following resources:: Breastfeeding Support Group, Support Group  Post-discharge interventions: Reviewed Newborn Safe Sleep Practices  Salena Saner, RN 08/09/2023 11:47

## 2024-01-29 IMAGING — MR MR BREAST BILAT WO/W CM
8 of 12 series · 34 of 48 positions shown · IV contrast (6 gadavist)
Comparison: Multiple previous mammograms. Previous breast MRI May

CLINICAL DATA: 7683K patient.  Annual screening.

EXAM:
BILATERAL BREAST MRI WITH AND WITHOUT CONTRAST
TECHNIQUE: Multiplanar, multisequence MR images of both breasts were obtained
prior to and following the intravenous administration of 6 ml of
Gadavist

[Series 2: t2_tirm_tra ipat (a-p) · axial · 3.0mm · 0.70mm/px · 1 of 61 slices shown]
[im 1/61]
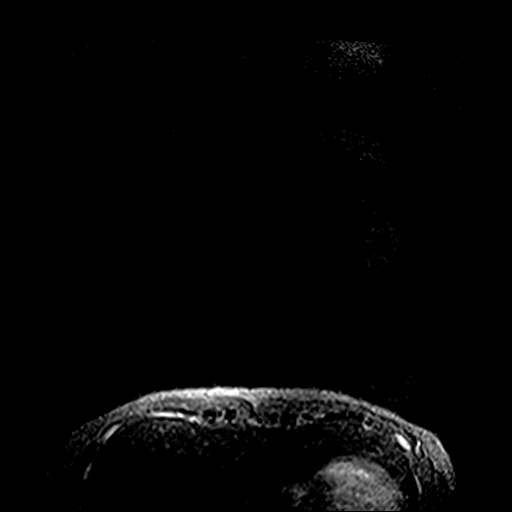

[Series 3: fl3d pre-cm no · axial · non-contrast · 1.2mm · 0.89mm/px · z∈[-93,+79]mm · 5 of 144 slices shown]
[im 1/144]
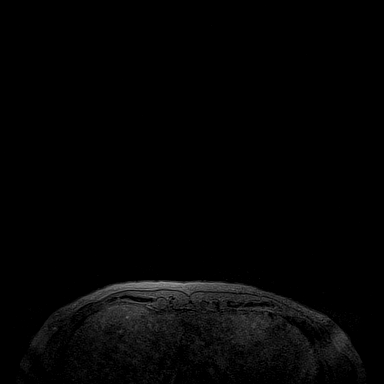
[im 36/144]
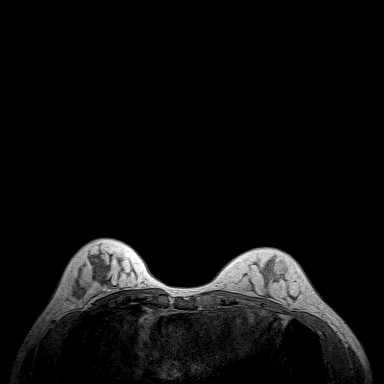
[im 72/144]
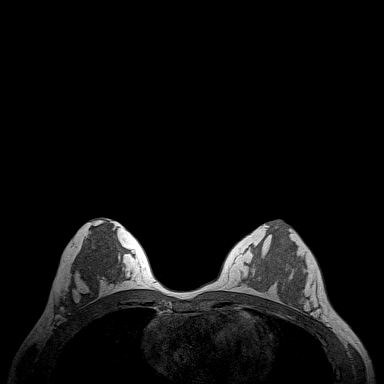
[im 108/144]
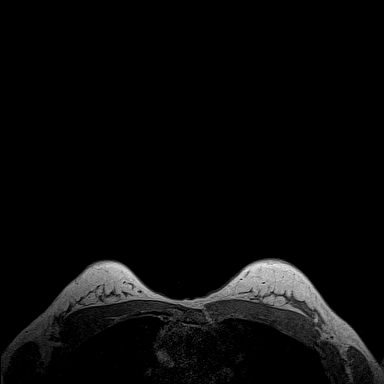
[im 144/144]
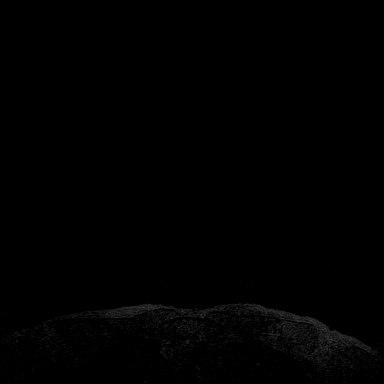

[Series 4: fl3d pre-cm · axial · non-contrast · 1.2mm · 0.89mm/px · z∈[-93,+79]mm · 5 of 144 slices shown]
[im 1/144]
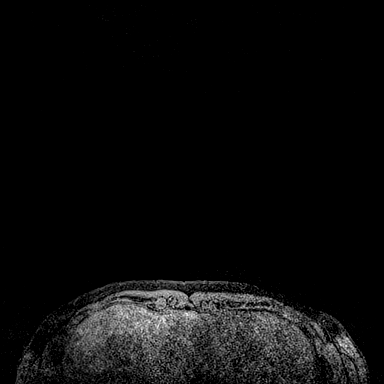
[im 36/144]
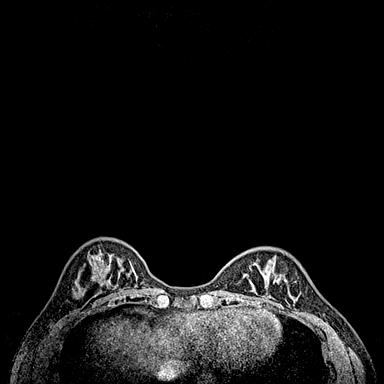
[im 72/144]
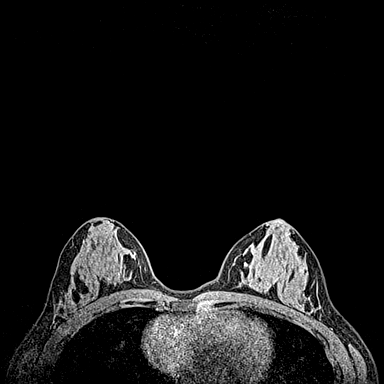
[im 108/144]
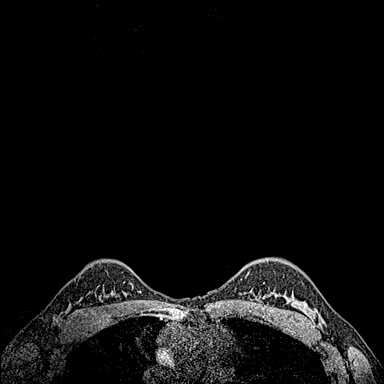
[im 144/144]
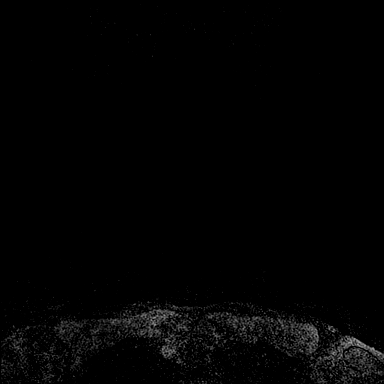

[Series 5: fl3d post-cm 20 · axial · 1.2mm · 0.89mm/px · z∈[-93,+79]mm · 5 of 144 slices shown (1 of 3)]
[im 1/144]
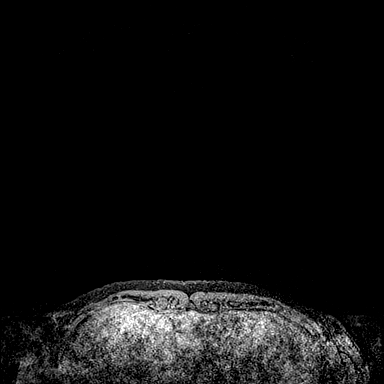
[im 36/144]
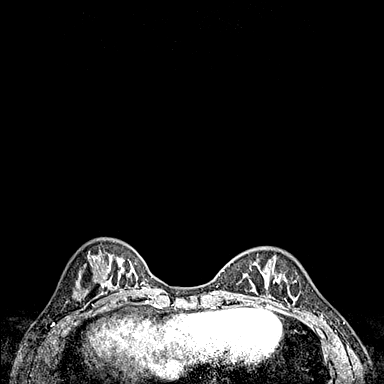
[im 72/144]
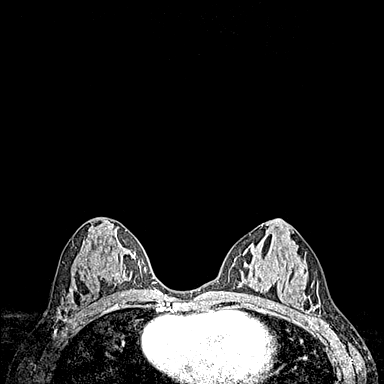
[im 108/144]
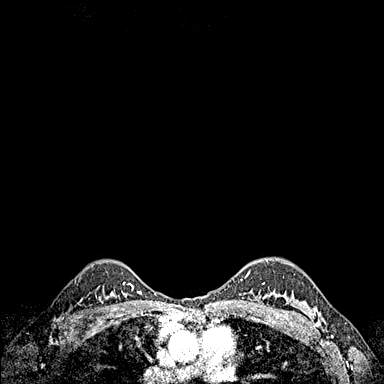
[im 144/144]
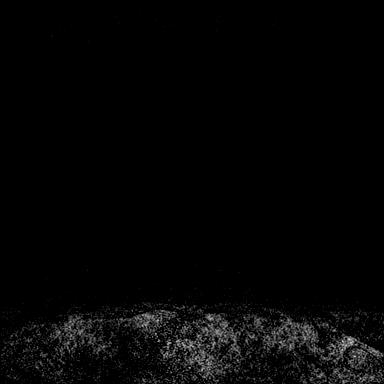

[Series 6: fl3d post-cm 20 · axial · 1.2mm · 0.89mm/px · z∈[-93,+79]mm · 5 of 144 slices shown (2 of 3)]
[im 1/144]
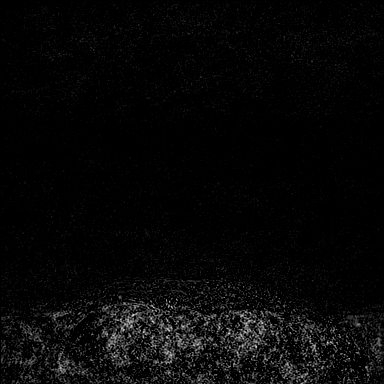
[im 36/144]
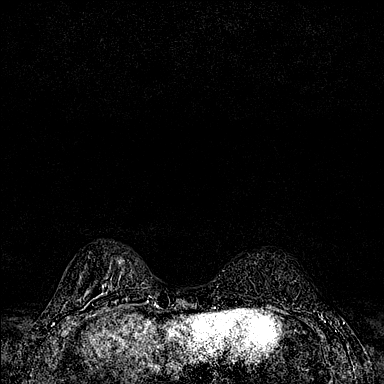
[im 72/144]
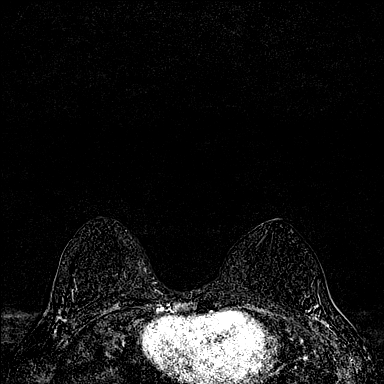
[im 108/144]
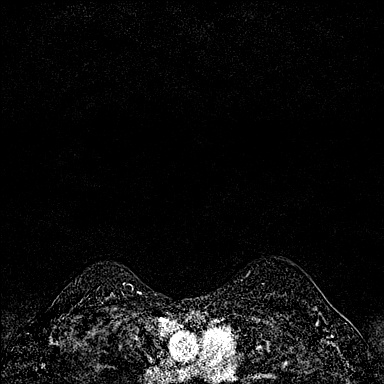
[im 144/144]
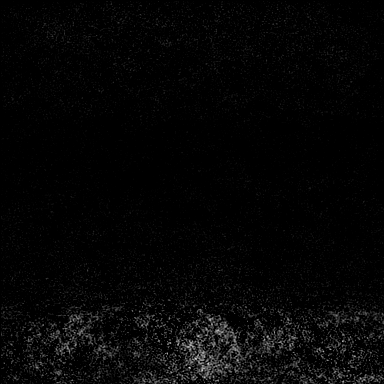

[Series 7: fl3d post-cm 20 · axial · 172.8mm · 0.89mm/px · 1 of 1 slices shown (3 of 3)]
[im 1/1]
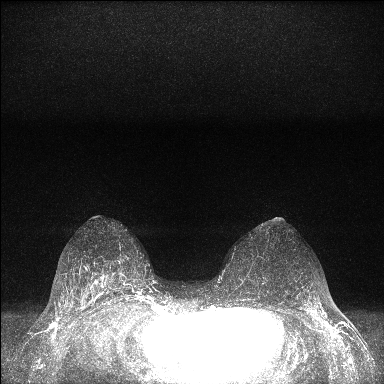

[Series 8: fl3d post-cm 3 · axial · 1.2mm · 0.89mm/px · z∈[-93,+79]mm · 6 of 144 slices shown (1 of 2)]
[im 1/144]
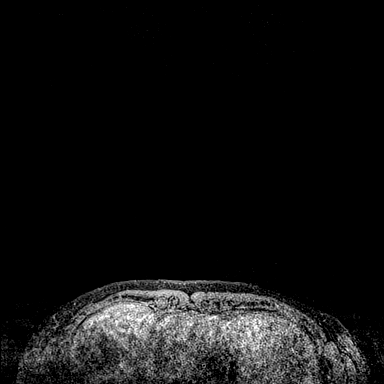
[im 29/144]
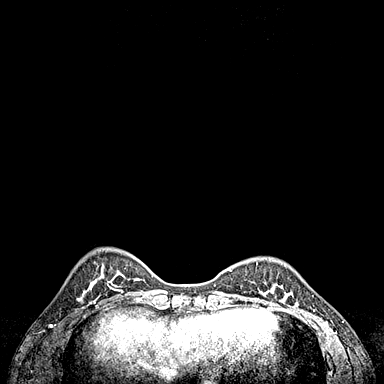
[im 58/144]
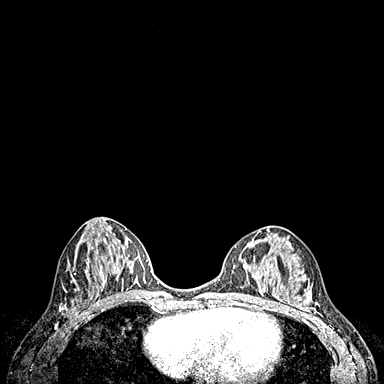
[im 86/144]
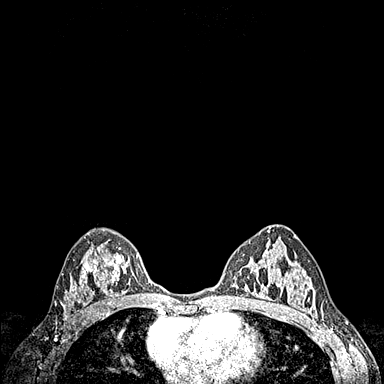
[im 115/144]
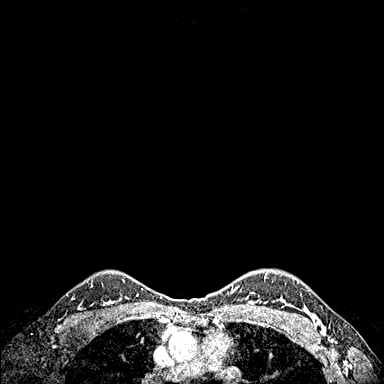
[im 144/144]
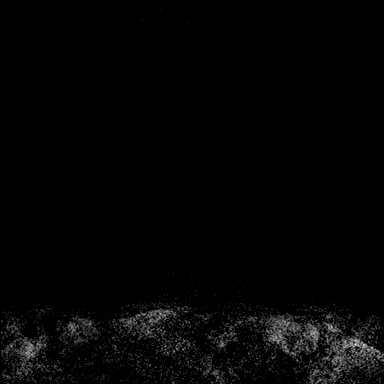

[Series 9: fl3d post-cm 3 · axial · 1.2mm · 0.89mm/px · z∈[-93,+79]mm · 6 of 144 slices shown (2 of 2)]
[im 1/144]
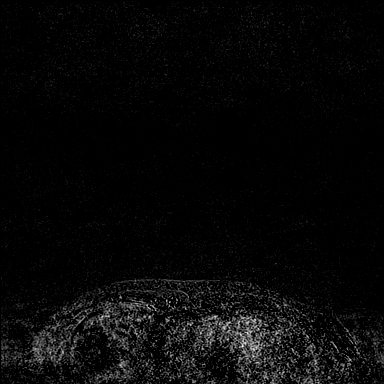
[im 29/144]
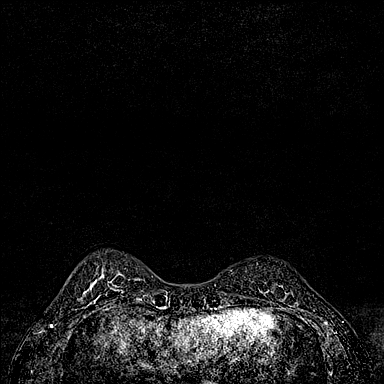
[im 58/144]
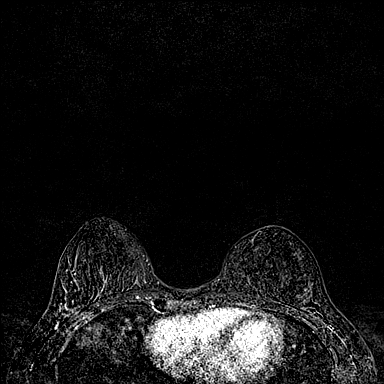
[im 86/144]
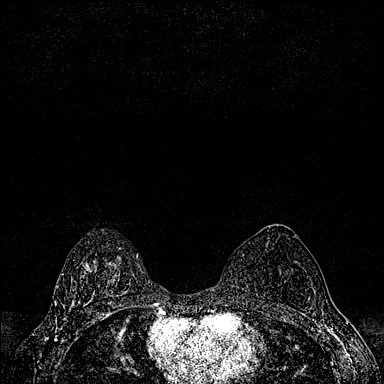
[im 115/144]
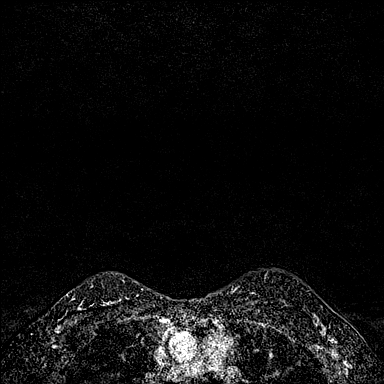
[im 144/144]
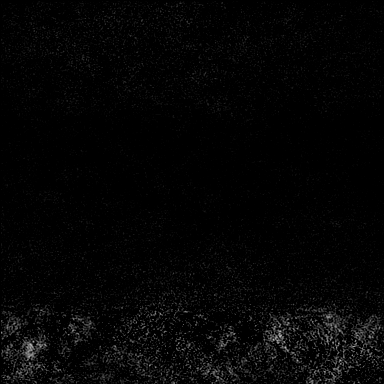

[34 of 48 positions shown; findings below may reference images not displayed]

Three-dimensional MR images were rendered by post-processing of the
original MR data on an independent workstation. The
three-dimensional MR images were interpreted, and findings are
reported in the following complete MRI report for this study. Three
dimensional images were evaluated at the independent interpreting
workstation using the [employer]aCAD thin client.
FINDINGS: Breast composition: c. Heterogeneous fibroglandular tissue.

Background parenchymal enhancement: Minimal

Right breast: No mass or abnormal enhancement.

Left breast: No mass or abnormal enhancement.

Lymph nodes: No abnormal appearing lymph nodes.

Ancillary findings:  None.
IMPRESSION: No MRI evidence of malignancy.

RECOMMENDATION:
Annual screening mammography and breast MRI given the patient's
[id number] status.

BI-RADS CATEGORY  1: Negative.
